# Patient Record
Sex: Female | Born: 2000 | Race: Black or African American | Hispanic: No | Marital: Single | Smoking: Never smoker
Health system: Southern US, Community
[De-identification: ages and names within clinical notes are randomized; demographics above are authoritative.]

---
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
## 2022-04-07 ENCOUNTER — Ambulatory Visit
Admission: EM | Admit: 2022-04-07 | Discharge: 2022-04-07 | Disposition: A | Discharge status: Home / Self Care | Payer: Medicaid Other | Attending: Urgent Care | Admitting: Urgent Care

## 2022-04-07 DIAGNOSIS — R070 Pain in throat: Secondary | ICD-10-CM | POA: Diagnosis not present

## 2022-04-07 DIAGNOSIS — J069 Acute upper respiratory infection, unspecified: Secondary | ICD-10-CM

## 2022-04-07 DIAGNOSIS — R519 Headache, unspecified: Secondary | ICD-10-CM | POA: Diagnosis present

## 2022-04-07 DIAGNOSIS — R42 Dizziness and giddiness: Secondary | ICD-10-CM | POA: Diagnosis not present

## 2022-04-07 DIAGNOSIS — R55 Syncope and collapse: Secondary | ICD-10-CM | POA: Diagnosis not present

## 2022-04-07 DIAGNOSIS — R0981 Nasal congestion: Secondary | ICD-10-CM | POA: Diagnosis present

## 2022-04-07 MED ORDER — CETIRIZINE HCL 10 MG PO TABS: 10.0000 mg | ORAL_TABLET | Freq: Every day | ORAL | 0 refills | Status: AC

## 2022-04-07 MED ORDER — PSEUDOEPHEDRINE HCL 30 MG PO TABS: 30.0000 mg | ORAL_TABLET | Freq: Three times a day (TID) | ORAL | 0 refills | Status: AC | PRN

## 2022-04-07 NOTE — ED Triage Notes (Signed)
Patient presents to Urgent Care with complaints of HA, chills, sore throat, and nasal congestion x 3 days. Also concerned with prediabetes she has had several episodes of dizziness in the past 3 months.  Treating symptoms with tylenol.

## 2022-04-07 NOTE — ED Provider Notes (Signed)
Candice Wilson   MRN: [id number] DOB: 01-Jan-2001  Subjective:   Candice Wilson is a 21 y.o. female presenting for 3-day history of acute onset chills, malaise, throat pain, sinus congestion, sinus headaches.  She has also had 22-month history of persistent intermittent dizziness.  She actually had an episode of syncope as well.  No chest pain, shortness of breath, heart racing, nausea, vomiting, abdominal pain.  She has concerns about prediabetes.  No history of neurologic disorder, cardiopulmonary disorders.  Patient is non-smoker.  No alcohol use.  No current facility-administered medications for this encounter. No current outpatient medications on file.   No Known Allergies  History reviewed. No pertinent past medical history.   History reviewed. No pertinent surgical history.  Family History  Problem Relation Age of Onset   Healthy Mother    Healthy Father     Social History   Tobacco Use   Smoking status: Never   Smokeless tobacco: Never  Vaping Use   Vaping Use: Never used  Substance Use Topics   Alcohol use: Never   Drug use: Yes    Types: Marijuana    ROS   Objective:   Vitals: BP 121/74   Pulse 64   Temp 98.3 F (36.8 C)   Resp 18   SpO2 98%   Physical Exam Constitutional:      General: She is not in acute distress.    Appearance: Normal appearance. She is well-developed and normal weight. She is not ill-appearing, toxic-appearing or diaphoretic.  HENT:     Head: Normocephalic and atraumatic.     Right Ear: Tympanic membrane, ear canal and external ear normal. No drainage or tenderness. No middle ear effusion. There is no impacted cerumen. Tympanic membrane is not erythematous.     Left Ear: Tympanic membrane, ear canal and external ear normal. No drainage or tenderness.  No middle ear effusion. There is no impacted cerumen. Tympanic membrane is not erythematous.     Nose: Congestion present. No rhinorrhea.     Mouth/Throat:     Mouth:  Mucous membranes are moist. No oral lesions.     Pharynx: No pharyngeal swelling, oropharyngeal exudate, posterior oropharyngeal erythema or uvula swelling.     Tonsils: No tonsillar exudate or tonsillar abscesses.  Eyes:     General: No scleral icterus.       Right eye: No discharge.        Left eye: No discharge.     Extraocular Movements: Extraocular movements intact.     Right eye: Normal extraocular motion.     Left eye: Normal extraocular motion.     Conjunctiva/sclera: Conjunctivae normal.  Cardiovascular:     Rate and Rhythm: Normal rate and regular rhythm.     Heart sounds: Normal heart sounds. No murmur heard.    No friction rub. No gallop.  Pulmonary:     Effort: Pulmonary effort is normal. No respiratory distress.     Breath sounds: No stridor. No wheezing, rhonchi or rales.  Chest:     Chest wall: No tenderness.  Musculoskeletal:     Cervical back: Normal range of motion and neck supple.  Lymphadenopathy:     Cervical: No cervical adenopathy.  Skin:    General: Skin is warm and dry.  Neurological:     General: No focal deficit present.     Mental Status: She is alert and oriented to person, place, and time.     Cranial Nerves: No cranial nerve deficit.  Motor: No weakness.     Coordination: Coordination normal.     Gait: Gait normal.     Deep Tendon Reflexes: Reflexes normal.  Psychiatric:        Mood and Affect: Mood normal.        Behavior: Behavior normal.        Thought Content: Thought content normal.        Judgment: Judgment normal.    Rapid strep test negative.  Assessment and Plan :   PDMP not reviewed this encounter.  1. Viral upper respiratory illness   2. Throat pain   3. Dizziness   4. Syncope, unspecified syncope type   5. Sinus headache   6. Sinus congestion    Labs pending.  Strep culture pending.  Suspect viral URI, viral syndrome. Physical exam findings reassuring and vital signs stable for discharge. Advised supportive care,  offered symptomatic relief.  Low suspicion for an acute encephalopathy, acute cardiopulmonary event.  Recommended establishing care with a PCP for follow-up regarding her dizziness and history of syncope.  Counseled patient on potential for adverse effects with medications prescribed/recommended today, ER and return-to-clinic precautions discussed, patient verbalized understanding.     Wallis Bamberg, PA-C 04/07/22 1113

## 2022-04-07 NOTE — Discharge Instructions (Signed)
Please try and set up your primary care through the [facility] website. You can simply go online and do a search for [facility]. Then select family medicine in a city you would like to be seen. They will provide you with options and appointment times.   We will manage this as a viral illness. For sore throat or cough try using a honey-based tea. Use 3 teaspoons of honey with juice squeezed from half lemon. Place shaved pieces of ginger into 1/2-1 cup of water and warm over stove top. Then mix the ingredients and repeat every 4 hours as needed. Please take ibuprofen 600mg  every 6 hours with food alternating with OR taken together with Tylenol 500mg -650mg  every 6 hours for throat pain, fevers, aches and pains. Hydrate very well with at least 2 liters of water. Eat light meals such as soups (chicken and noodles, vegetable, chicken and wild rice).  Do not eat foods that you are allergic to.  Taking an antihistamine like Zyrtec can help against postnasal drainage, sinus congestion which can cause sinus pain, sinus headaches, throat pain, painful swallowing, coughing.  You can take this together with pseudoephedrine (Sudafed) at a dose of 30-60 mg 3 times a day or twice daily as needed for the same kind of nasal drip, congestion.

## 2022-04-08 LAB — BASIC METABOLIC PANEL
BUN/Creatinine Ratio: 10 (ref 9–23)
BUN: 7 mg/dL (ref 6–20)
CO2: 23 mmol/L (ref 20–29)
Calcium: 9.4 mg/dL (ref 8.7–10.2)
Chloride: 104 mmol/L (ref 96–106)
Creatinine, Ser: 0.7 mg/dL (ref 0.57–1.00)
Glucose: 82 mg/dL (ref 70–99)
Potassium: 4.8 mmol/L (ref 3.5–5.2)
Sodium: 140 mmol/L (ref 134–144)
eGFR: 128 mL/min/{1.73_m2} (ref >59)

## 2022-04-08 LAB — CBC WITH DIFFERENTIAL/PLATELET
Basophils Absolute: 0.1 10*3/uL (ref 0.0–0.2)
Basos: 1 %
EOS (ABSOLUTE): 0.5 10*3/uL — ABNORMAL HIGH (ref 0.0–0.4)
Eos: 7 %
Hematocrit: 34.9 % (ref 34.0–46.6)
Hemoglobin: 11.6 g/dL (ref 11.1–15.9)
Immature Grans (Abs): 0 10*3/uL (ref 0.0–0.1)
Immature Granulocytes: 0 %
Lymphocytes Absolute: 1.5 10*3/uL (ref 0.7–3.1)
Lymphs: 20 %
MCH: 28.6 pg (ref 26.6–33.0)
MCHC: 33.2 g/dL (ref 31.5–35.7)
MCV: 86 fL (ref 79–97)
Monocytes Absolute: 0.5 10*3/uL (ref 0.1–0.9)
Monocytes: 7 %
Neutrophils Absolute: 4.7 10*3/uL (ref 1.4–7.0)
Neutrophils: 65 %
Platelets: 223 10*3/uL (ref 150–450)
RBC: 4.05 x10E6/uL (ref 3.77–5.28)
RDW: 12.9 % (ref 11.7–15.4)
WBC: 7.3 10*3/uL (ref 3.4–10.8)

## 2022-04-08 LAB — TSH: TSH: 0.997 u[IU]/mL (ref 0.450–4.500)

## 2022-04-09 LAB — CULTURE, GROUP A STREP (THRC)

## 2023-05-20 ENCOUNTER — Ambulatory Visit
Admission: EM | Admit: 2023-05-20 | Discharge: 2023-05-20 | Disposition: A | Discharge status: Home / Self Care | Payer: Medicaid Other | Attending: Family Medicine | Admitting: Family Medicine

## 2023-05-20 DIAGNOSIS — J029 Acute pharyngitis, unspecified: Secondary | ICD-10-CM | POA: Diagnosis not present

## 2023-05-20 DIAGNOSIS — J039 Acute tonsillitis, unspecified: Secondary | ICD-10-CM

## 2023-05-20 LAB — POCT RAPID STREP A (OFFICE): Rapid Strep A Screen: NEGATIVE

## 2023-05-20 MED ORDER — AMOXICILLIN 500 MG PO TABS: 500.0000 mg | ORAL_TABLET | Freq: Two times a day (BID) | ORAL | 0 refills | Status: AC

## 2023-05-20 MED ORDER — PREDNISOLONE SODIUM PHOSPHATE 15 MG/5ML PO SOLN
15.0000 mg | Freq: Once | ORAL | Status: AC
  Administered 2023-05-20: 15 mg via ORAL

## 2023-05-20 MED ORDER — PREDNISONE 20 MG PO TABS: 20.0000 mg | ORAL_TABLET | Freq: Every day | ORAL | 0 refills | Status: AC

## 2023-05-20 NOTE — ED Provider Notes (Signed)
[facility]    CSN: [id number] Arrival date & time: 05/20/23  1533      History   Chief Complaint Chief Complaint  Patient presents with   Sore Throat   Otalgia   Fever   Generalized Body Aches    HPI Candice Wilson is a 22 y.o. female.   HPI Sore throat x 3 days. Fever and body aches more significant x 1 days. Pain with swallowing.  No history of recurrent step.  Patient has taken Tylenol for management of fever and bodyaches.  She is afebrile at present. Denies any known sick contacts. History reviewed. No pertinent past medical history.  There are no problems to display for this patient.   History reviewed. No pertinent surgical history.  OB History   No obstetric history on file.      Home Medications    Prior to Admission medications   Medication Sig Start Date End Date Taking? Authorizing Provider  amoxicillin (AMOXIL) 500 MG tablet Take 1 tablet (500 mg total) by mouth 2 (two) times daily for 10 days. 05/20/23 05/30/23 Yes Bing Neighbors, NP  predniSONE (DELTASONE) 20 MG tablet Take 1 tablet (20 mg total) by mouth daily with breakfast for 3 days. 05/21/23 05/24/23 Yes Bing Neighbors, NP  cetirizine (ZYRTEC ALLERGY) 10 MG tablet Take 1 tablet (10 mg total) by mouth daily. 04/07/22   Wallis Bamberg, PA-C  pseudoephedrine (SUDAFED) 30 MG tablet Take 1 tablet (30 mg total) by mouth every 8 (eight) hours as needed for congestion. 04/07/22   Wallis Bamberg, PA-C    Family History Family History  Problem Relation Age of Onset   Healthy Mother    Healthy Father     Social History Social History   Tobacco Use   Smoking status: Never   Smokeless tobacco: Never  Vaping Use   Vaping status: Never Used  Substance Use Topics   Alcohol use: Never   Drug use: Yes    Types: Marijuana     Allergies   Patient has no known allergies.  Review of Systems Review of Systems Pertinent negatives listed in HPI  Physical Exam Triage Vital Signs ED Triage  Vitals  Encounter Vitals Group     BP 05/20/23 1644 105/69     Systolic BP Percentile --      Diastolic BP Percentile --      Pulse Rate 05/20/23 1644 70     Resp 05/20/23 1644 16     Temp 05/20/23 1644 98 F (36.7 C)     Temp Source 05/20/23 1644 Temporal     SpO2 05/20/23 1644 98 %     Weight --      Height --      Head Circumference --      Peak Flow --      Pain Score 05/20/23 1645 8     Pain Loc --      Pain Education --      Exclude from Growth Chart --    No data found.  Updated Vital Signs BP 105/69 (BP Location: Left Arm)   Pulse 70   Temp 98 F (36.7 C) (Temporal)   Resp 16   LMP 04/29/2023 (Approximate)   SpO2 98%   Visual Acuity Right Eye Distance:   Left Eye Distance:   Bilateral Distance:    Right Eye Near:   Left Eye Near:    Bilateral Near:     Physical Exam Constitutional:  Appearance: She is well-developed.  HENT:     Head: Normocephalic and atraumatic.     Right Ear: Tympanic membrane and ear canal normal.     Left Ear: Tympanic membrane and ear canal normal.     Nose: No congestion or rhinorrhea.     Mouth/Throat:     Pharynx: Pharyngeal swelling, oropharyngeal exudate, posterior oropharyngeal erythema and uvula swelling present.     Tonsils: Tonsillar exudate present. 3+ on the right. 3+ on the left.  Eyes:     Conjunctiva/sclera: Conjunctivae normal.     Pupils: Pupils are equal, round, and reactive to light.  Cardiovascular:     Rate and Rhythm: Normal rate and regular rhythm.  Pulmonary:     Effort: Pulmonary effort is normal.     Breath sounds: Normal breath sounds.  Musculoskeletal:     Cervical back: Normal range of motion.  Lymphadenopathy:     Cervical: Cervical adenopathy present.  Skin:    General: Skin is warm and dry.     Capillary Refill: Capillary refill takes less than 2 seconds.     Comments: Diaphoretic   Neurological:     General: No focal deficit present.     Mental Status: She is alert.      UC  Treatments / Results  Labs (all labs ordered are listed, but only abnormal results are displayed) Labs Reviewed  POCT RAPID STREP A (OFFICE)    EKG   Radiology No results found.  Procedures Procedures (including critical care time)  Medications Ordered in UC Medications  prednisoLONE (ORAPRED) 15 MG/5ML solution 15 mg (15 mg Oral Given 05/20/23 1734)  prednisoLONE (ORAPRED) 15 MG/5ML solution 15 mg (15 mg Oral Given 05/20/23 1737)    Initial Impression / Assessment and Plan / UC Course  I have reviewed the triage vital signs and the nursing notes.  Pertinent labs & imaging results that were available during my care of the patient were reviewed by me and considered in my medical decision making (see chart for details).    Rapid strep was negative.  Treating empirically for streptococcal infection based on evaluation findings.  Patient also given Orapred here in clinic as patient is experiencing significant swelling of the tonsils and throat.  Patient is able to swallow without difficulty.  Treatment per discharge medication orders.  Strict ED precautions given if throat swelling worsens or does not improve. Final Clinical Impressions(s) / UC Diagnoses   Final diagnoses:  Acute pharyngitis, unspecified etiology  Acute tonsillitis, unspecified etiology     Discharge Instructions      Start prednisone tomorrow and take for 3 days.     ED Prescriptions     Medication Sig Dispense Auth. Provider   amoxicillin (AMOXIL) 500 MG tablet Take 1 tablet (500 mg total) by mouth 2 (two) times daily for 10 days. 20 tablet Bing Neighbors, NP   predniSONE (DELTASONE) 20 MG tablet Take 1 tablet (20 mg total) by mouth daily with breakfast for 3 days. 3 tablet Bing Neighbors, NP      PDMP not reviewed this encounter.   Bing Neighbors, NP 05/23/23 1009

## 2023-05-20 NOTE — ED Triage Notes (Signed)
Patient presents to UC for sore throat, body aches, low grade fever (99.0), and white patch back of throat  x 2 days. Treating pain with BC and tylenol.

## 2023-05-20 NOTE — Discharge Instructions (Signed)
Start prednisone tomorrow and take for 3 days.

## 2023-05-22 ENCOUNTER — Other Ambulatory Visit: Payer: Self-pay

## 2023-05-22 ENCOUNTER — Emergency Department (HOSPITAL_COMMUNITY)
Admission: EM | Admit: 2023-05-22 | Discharge: 2023-05-22 | Disposition: A | Discharge status: Home / Self Care | Payer: Medicaid Other | Attending: Emergency Medicine | Admitting: Emergency Medicine

## 2023-05-22 DIAGNOSIS — J039 Acute tonsillitis, unspecified: Secondary | ICD-10-CM | POA: Diagnosis not present

## 2023-05-22 DIAGNOSIS — D649 Anemia, unspecified: Secondary | ICD-10-CM | POA: Diagnosis not present

## 2023-05-22 DIAGNOSIS — Z1152 Encounter for screening for COVID-19: Secondary | ICD-10-CM | POA: Diagnosis not present

## 2023-05-22 DIAGNOSIS — J029 Acute pharyngitis, unspecified: Secondary | ICD-10-CM | POA: Diagnosis present

## 2023-05-22 LAB — COMPREHENSIVE METABOLIC PANEL
ALT: 30 U/L (ref 0–44)
AST: 44 U/L — ABNORMAL HIGH (ref 15–41)
Albumin: 3.5 g/dL (ref 3.5–5.0)
Alkaline Phosphatase: 45 U/L (ref 38–126)
Anion gap: 10 (ref 5–15)
BUN: 9 mg/dL (ref 6–20)
CO2: 19 mmol/L — ABNORMAL LOW (ref 22–32)
Calcium: 8.4 mg/dL — ABNORMAL LOW (ref 8.9–10.3)
Chloride: 101 mmol/L (ref 98–111)
Creatinine, Ser: 0.76 mg/dL (ref 0.44–1.00)
GFR, Estimated: >60 mL/min (ref >60)
Glucose, Bld: 92 mg/dL (ref 70–99)
Potassium: 3.6 mmol/L (ref 3.5–5.1)
Sodium: 130 mmol/L — ABNORMAL LOW (ref 135–145)
Total Bilirubin: 1 mg/dL (ref 0.3–1.2)
Total Protein: 7.8 g/dL (ref 6.5–8.1)

## 2023-05-22 LAB — CBC WITH DIFFERENTIAL/PLATELET
Abs Immature Granulocytes: 0.04 10*3/uL (ref 0.00–0.07)
Basophils Absolute: 0 10*3/uL (ref 0.0–0.1)
Basophils Relative: 0 %
Eosinophils Absolute: 0 10*3/uL (ref 0.0–0.5)
Eosinophils Relative: 0 %
HCT: 35.2 % — ABNORMAL LOW (ref 36.0–46.0)
Hemoglobin: 11.7 g/dL — ABNORMAL LOW (ref 12.0–15.0)
Immature Granulocytes: 0 %
Lymphocytes Relative: 14 %
Lymphs Abs: 1.4 10*3/uL (ref 0.7–4.0)
MCH: 28.3 pg (ref 26.0–34.0)
MCHC: 33.2 g/dL (ref 30.0–36.0)
MCV: 85.2 fL (ref 80.0–100.0)
Monocytes Absolute: 0.9 10*3/uL (ref 0.1–1.0)
Monocytes Relative: 9 %
Neutro Abs: 7.2 10*3/uL (ref 1.7–7.7)
Neutrophils Relative %: 77 %
Platelets: 190 10*3/uL (ref 150–400)
RBC: 4.13 MIL/uL (ref 3.87–5.11)
RDW: 12.8 % (ref 11.5–15.5)
WBC: 9.6 10*3/uL (ref 4.0–10.5)
nRBC: 0 % (ref 0.0–0.2)

## 2023-05-22 LAB — MONONUCLEOSIS SCREEN: Mono Screen: NEGATIVE

## 2023-05-22 LAB — GROUP A STREP BY PCR: Group A Strep by PCR: NOT DETECTED

## 2023-05-22 LAB — SARS CORONAVIRUS 2 BY RT PCR: SARS Coronavirus 2 by RT PCR: NEGATIVE

## 2023-05-22 MED ORDER — DEXAMETHASONE SODIUM PHOSPHATE 10 MG/ML IJ SOLN
10.0000 mg | Freq: Once | INTRAMUSCULAR | Status: AC
  Administered 2023-05-22: 10 mg via INTRAMUSCULAR
  Filled 2023-05-22: qty 1

## 2023-05-22 MED ORDER — ACETAMINOPHEN 325 MG PO TABS
650.0000 mg | ORAL_TABLET | Freq: Once | ORAL | Status: AC | PRN
  Administered 2023-05-22: 650 mg via ORAL
  Filled 2023-05-22: qty 2

## 2023-05-22 NOTE — ED Provider Notes (Addendum)
Patient signed to me by prior provider pending results of monotest which was negative.  Patient's throat exam and does show some tonsillar hypertrophy.  Suspect viral etiology.  Will give dose of Decadron.  COVID test here.  Patient will follow-up on results when she gets home   Lorre Nick, MD 05/22/23 0831    Lorre Nick, MD 05/22/23 [phone]

## 2023-05-22 NOTE — Discharge Instructions (Signed)
Use Tylenol and Motrin as directed.  Follow-up in MyChart for the results of your COVID test

## 2023-05-22 NOTE — ED Notes (Signed)
Pt. Refused covid test states she had one earlier

## 2023-05-22 NOTE — ED Provider Notes (Signed)
[place] EMERGENCY DEPARTMENT AT [facility] Provider Note   CSN: [id number] Arrival date & time: 05/22/23  4098     History  Chief Complaint  Patient presents with   Sore Throat    Candice Wilson is a 22 y.o. female.  The history is provided by the patient.  Sore Throat  She complains of a sore throat for about the last 5 days.  Pain is worse on the right and does radiate to her right ear.  She went to an urgent care center 2 days ago and was prescribed amoxicillin and prednisone, strep test was negative.  However, she states that she is getting worse since then.  She denies fever, chills, sweats.  She is able to swallow, but it is painful.  Pain radiates to her right ear, especially when she swallows.  She has noted that her voice has been muffled.   Home Medications Prior to Admission medications   Medication Sig Start Date End Date Taking? Authorizing Provider  amoxicillin (AMOXIL) 500 MG tablet Take 1 tablet (500 mg total) by mouth 2 (two) times daily for 10 days. 05/20/23 05/30/23  Bing Neighbors, NP  cetirizine (ZYRTEC ALLERGY) 10 MG tablet Take 1 tablet (10 mg total) by mouth daily. 04/07/22   Wallis Bamberg, PA-C  predniSONE (DELTASONE) 20 MG tablet Take 1 tablet (20 mg total) by mouth daily with breakfast for 3 days. 05/21/23 05/24/23  Bing Neighbors, NP  pseudoephedrine (SUDAFED) 30 MG tablet Take 1 tablet (30 mg total) by mouth every 8 (eight) hours as needed for congestion. 04/07/22   Wallis Bamberg, PA-C      Allergies    Patient has no known allergies.    Review of Systems   Review of Systems  All other systems reviewed and are negative.   Physical Exam Updated Vital Signs BP 122/71   Pulse 79   Temp 100.3 F (37.9 C) (Oral)   LMP 04/29/2023 (Approximate)   SpO2 94%  Physical Exam Vitals and nursing note reviewed.   22 year old female, resting comfortably and in no acute distress. Vital signs are significant for borderline elevated temperature.  Oxygen saturation is 94 %, which is normal. Head is normocephalic and atraumatic. PERRLA, EOMI. Oropharynx shows tonsillar erythema with exudate bilaterally, left greater than right.  There is mild to moderate tonsillar hypertrophy.  There is no pooling of secretions.  Tympanic membranes are clear. Neck is nontender and supple.  There is tender anterior and posterior cervical adenopathy bilaterally, greater on the right. Back is nontender and there is no CVA tenderness. Lungs are clear without rales, wheezes, or rhonchi. Chest is nontender. Heart has regular rate and rhythm without murmur. Abdomen is soft, flat, nontender. Extremities have no cyanosis or edema, full range of motion is present. Skin is warm and dry without rash. Neurologic: Mental status is normal, cranial nerves are intact, moves all extremities equally.  ED Results / Procedures / Treatments   Labs (all labs ordered are listed, but only abnormal results are displayed) Labs Reviewed  CBC WITH DIFFERENTIAL/PLATELET - Abnormal; Notable for the following components:      Result Value   Hemoglobin 11.7 (*)    HCT 35.2 (*)    All other components within normal limits  GROUP A STREP BY PCR  COMPREHENSIVE METABOLIC PANEL  MONONUCLEOSIS SCREEN   Procedures Procedures    Medications Ordered in ED Medications  acetaminophen (TYLENOL) tablet 650 mg (650 mg Oral Given 05/22/23 0553)  ED Course/ Medical Decision Making/ A&P                                 Medical Decision Making Amount and/or Complexity of Data Reviewed Labs: ordered.  Risk OTC drugs.   Tonsillitis which has not responded to amoxicillin and with negative strep test.  I reviewed her past records, and she was seen at urgent care on 05/20/2023 at which time strep antigen assay was negative.  I doubt she has a strep infection, even though strep antigen has a 10-15% false negative rate.  Strep would have been expected to respond to amoxicillin.  Presence of  cervical adenopathy makes me concerned about infectious mononucleosis.  Strep PCR was ordered by triage, I have ordered CBC, comprehensive metabolic panel, monoscreen.  I have reviewed and interpreted her laboratory tests that were available, and my interpretation is negative strep PCR, no evidence of strep infection.  Mild anemia which is unchanged from baseline, normal WBC and differential.  Comprehensive metabolic panel and monoscreen are pending.  Case is signed out to Dr. Freida Busman.  Final Clinical Impression(s) / ED Diagnoses Final diagnoses:  Tonsillitis  Normochromic normocytic anemia    Rx / DC Orders ED Discharge Orders     None         Dione Booze, MD 05/22/23 [phone]

## 2023-05-22 NOTE — ED Triage Notes (Signed)
Patient coming to ED for evaluation of sore throat, ear ache, and fever.  States she was seen at Urgent Care on 8/2 for same.  Strep test was negative.  Started on Prednisone and Amoxicillin.  States pain and swelling have increased.  Throat red, tonsils swollen, and "white patches" present on R tonsil

## 2023-10-18 ENCOUNTER — Emergency Department
Admission: EM | Admit: 2023-10-18 | Discharge: 2023-10-18 | Discharge status: Against Medical Advice | Payer: Medicaid Other | Source: Home / Self Care
